# Patient Record
Sex: Female | Born: 1970 | Race: Black or African American | Hispanic: No | Marital: Single | Smoking: Never smoker
Health system: Southern US, Community
[De-identification: ages and names within clinical notes are randomized; demographics above are authoritative.]

## PROBLEM LIST (undated history)

## (undated) DIAGNOSIS — E282 Polycystic ovarian syndrome: Secondary | ICD-10-CM

## (undated) DIAGNOSIS — J40 Bronchitis, not specified as acute or chronic: Secondary | ICD-10-CM

## (undated) DIAGNOSIS — R030 Elevated blood-pressure reading, without diagnosis of hypertension: Secondary | ICD-10-CM

## (undated) HISTORY — DX: Elevated blood-pressure reading, without diagnosis of hypertension: R03.0

## (undated) HISTORY — DX: Polycystic ovarian syndrome: E28.2

## (undated) HISTORY — PX: WISDOM TOOTH EXTRACTION: SHX21

## (undated) HISTORY — DX: Bronchitis, not specified as acute or chronic: J40

---

## 2020-11-24 ENCOUNTER — Ambulatory Visit (AMBULATORY_SURGERY_CENTER): Payer: 59 | Admitting: *Deleted

## 2020-11-24 ENCOUNTER — Other Ambulatory Visit: Payer: Self-pay

## 2020-11-24 VITALS — Ht 63.0 in | Wt 181.0 lb

## 2020-11-24 DIAGNOSIS — Z1211 Encounter for screening for malignant neoplasm of colon: Secondary | ICD-10-CM

## 2020-11-24 MED ORDER — PEG-KCL-NACL-NASULF-NA ASC-C 100 G PO SOLR: 1.0000 | Freq: Once | ORAL | 0 refills | Status: AC

## 2020-11-24 NOTE — Progress Notes (Signed)
Patient's pre-visit was done today over the phone with the patient due to COVID-19 pandemic. Name,DOB and address verified. Insurance verified. Patient denies any allergies to Eggs and Soy. Patient denies any problems with anesthesia/sedation. Patient denies taking diet pills or blood thinners. Packet of Prep instructions mailed to patient including a copy of a consent form-pt is aware. Patient understands to call us back with any questions or concerns. Patient is aware of our care-partner policy and [id number] safety protocol. The patient is COVID-19 vaccinated, per patient.

## 2020-12-08 ENCOUNTER — Ambulatory Visit (AMBULATORY_SURGERY_CENTER): Payer: 59 | Admitting: Gastroenterology

## 2020-12-08 ENCOUNTER — Other Ambulatory Visit: Payer: Self-pay

## 2020-12-08 ENCOUNTER — Encounter: Payer: Self-pay | Admitting: Gastroenterology

## 2020-12-08 VITALS — BP 159/78 | HR 75 | Temp 97.8°F | Resp 11 | Ht 63.0 in | Wt 181.0 lb

## 2020-12-08 DIAGNOSIS — D122 Benign neoplasm of ascending colon: Secondary | ICD-10-CM

## 2020-12-08 DIAGNOSIS — Z1211 Encounter for screening for malignant neoplasm of colon: Secondary | ICD-10-CM

## 2020-12-08 DIAGNOSIS — D120 Benign neoplasm of cecum: Secondary | ICD-10-CM | POA: Diagnosis not present

## 2020-12-08 NOTE — Op Note (Signed)
[facility] Patient Name: Elizabeth Gilmore Procedure Date: 12/08/2020 10:35 AM MRN: [id number] Endoscopist: Remo Lipps P. Havery Moros , MD Age: 50 Referring MD:  Date of Birth: 1971/04/20 Gender: Female Account #: [id number] Procedure:                Colonoscopy Indications:              Screening for colorectal malignant neoplasm, This                            is the patient's first colonoscopy Medicines:                Monitored Anesthesia Care Procedure:                Pre-Anesthesia Assessment:                           - Prior to the procedure, a History and Physical                            was performed, and patient medications and                            allergies were reviewed. The patient's tolerance of                            previous anesthesia was also reviewed. The risks                            and benefits of the procedure and the sedation                            options and risks were discussed with the patient.                            All questions were answered, and informed consent                            was obtained. Prior Anticoagulants: The patient has                            taken no previous anticoagulant or antiplatelet                            agents. ASA Grade Assessment: II - A patient with                            mild systemic disease. After reviewing the risks                            and benefits, the patient was deemed in                            satisfactory condition to undergo the procedure.  After obtaining informed consent, the colonoscope                            was passed under direct vision. Throughout the                            procedure, the patient's blood pressure, pulse, and                            oxygen saturations were monitored continuously. The                            Olympus PFC-[id number] (#[id number]) Colonoscope was                            introduced through the  anus and advanced to the the                            cecum, identified by appendiceal orifice and                            ileocecal valve. The colonoscopy was performed                            without difficulty. The patient tolerated the                            procedure well. The quality of the bowel                            preparation was good. The ileocecal valve,                            appendiceal orifice, and rectum were photographed. Scope In: 10:42:28 AM Scope Out: 10:59:45 AM Scope Withdrawal Time: 0 hours 14 minutes 59 seconds  Total Procedure Duration: 0 hours 17 minutes 17 seconds  Findings:                 The perianal and digital rectal examinations were                            normal.                           A 2 mm polyp was found in the cecum. The polyp was                            sessile. The polyp was removed with a cold biopsy                            forceps. Resection and retrieval were complete.                           A 3 mm polyp was found in the ascending colon. The  polyp was sessile. The polyp was removed with a                            cold biopsy forceps. Resection and retrieval were                            complete.                           Internal hemorrhoids were found during                            retroflexion. The hemorrhoids were small.                           The exam was otherwise without abnormality. Complications:            No immediate complications. Estimated blood loss:                            Minimal. Estimated Blood Loss:     Estimated blood loss was minimal. Impression:               - One 2 mm polyp in the cecum, removed with a cold                            biopsy forceps. Resected and retrieved.                           - One 3 mm polyp in the ascending colon, removed                            with a cold biopsy forceps. Resected and retrieved.                            - Internal hemorrhoids.                           - The examination was otherwise normal. Recommendation:           - Patient has a contact number available for                            emergencies. The signs and symptoms of potential                            delayed complications were discussed with the                            patient. Return to normal activities tomorrow.                            Written discharge instructions were provided to the                            patient.                           -  Resume previous diet.                           - Continue present medications.                           - Await pathology results. Remo Lipps P. [name], MD 12/08/2020 11:03:33 AM This report has been signed electronically.

## 2020-12-08 NOTE — Patient Instructions (Signed)
Discharge instructions given. Handouts on polyps and hemorrhoids. Resume previous medications. YOU HAD AN ENDOSCOPIC PROCEDURE TODAY AT THE [facility]:   Refer to the procedure report that was given to you for any specific questions about what was found during the examination.  If the procedure report does not answer your questions, please call your gastroenterologist to clarify.  If you requested that your care partner not be given the details of your procedure findings, then the procedure report has been included in a sealed envelope for you to review at your convenience later.  YOU SHOULD EXPECT: Some feelings of bloating in the abdomen. Passage of more gas than usual.  Walking can help get rid of the air that was put into your GI tract during the procedure and reduce the bloating. If you had a lower endoscopy (such as a colonoscopy or flexible sigmoidoscopy) you may notice spotting of blood in your stool or on the toilet paper. If you underwent a bowel prep for your procedure, you may not have a normal bowel movement for a few days.  Please Note:  You might notice some irritation and congestion in your nose or some drainage.  This is from the oxygen used during your procedure.  There is no need for concern and it should clear up in a day or so.  SYMPTOMS TO REPORT IMMEDIATELY:  Following lower endoscopy (colonoscopy or flexible sigmoidoscopy):  Excessive amounts of blood in the stool  Significant tenderness or worsening of abdominal pains  Swelling of the abdomen that is new, acute  Fever of 100F or higher   For urgent or emergent issues, a gastroenterologist can be reached at any hour by calling [phone]. Do not use MyChart messaging for urgent concerns.    DIET:  We do recommend a small meal at first, but then you may proceed to your regular diet.  Drink plenty of fluids but you should avoid alcoholic beverages for 24 hours.  ACTIVITY:  You should plan to take it  easy for the rest of today and you should NOT DRIVE or use heavy machinery until tomorrow (because of the sedation medicines used during the test).    FOLLOW UP: Our staff will call the number listed on your records 48-72 hours following your procedure to check on you and address any questions or concerns that you may have regarding the information given to you following your procedure. If we do not reach you, we will leave a message.  We will attempt to reach you two times.  During this call, we will ask if you have developed any symptoms of COVID 19. If you develop any symptoms (ie: fever, flu-like symptoms, shortness of breath, cough etc.) before then, please call [phone].  If you test positive for Covid 19 in the 2 weeks post procedure, please call and report this information to us.    If any biopsies were taken you will be contacted by phone or by letter within the next 1-3 weeks.  Please call us at [phone] if you have not heard about the biopsies in 3 weeks.    SIGNATURES/CONFIDENTIALITY: You and/or your care partner have signed paperwork which will be entered into your electronic medical record.  These signatures attest to the fact that that the information above on your After Visit Summary has been reviewed and is understood.  Full responsibility of the confidentiality of this discharge information lies with you and/or your care-partner.  

## 2020-12-08 NOTE — Progress Notes (Signed)
Pt's states no medical or surgical changes since previsit or office visit. 

## 2020-12-08 NOTE — Progress Notes (Signed)
PT taken to PACU. Monitors in place. VSS. Report given to RN. 

## 2020-12-08 NOTE — Progress Notes (Signed)
Called to room to assist during endoscopic procedure.  Patient ID and intended procedure confirmed with present staff. Received instructions for my participation in the procedure from the performing physician.  

## 2020-12-12 ENCOUNTER — Telehealth: Payer: Self-pay

## 2020-12-12 NOTE — Telephone Encounter (Signed)
  Follow up Call-  Call back number 12/08/2020  Post procedure Call Back phone  # [phone]  Permission to leave phone message Yes     1st follow up call made. NALM

## 2020-12-12 NOTE — Telephone Encounter (Signed)
Attempted to reach patient for post-procedure f/u call. No answer. Left message for her to please not hesitate to call us if she has any questions/concerns regarding her care. 

## 2020-12-13 ENCOUNTER — Other Ambulatory Visit: Payer: Self-pay

## 2020-12-13 ENCOUNTER — Emergency Department (HOSPITAL_COMMUNITY)
Admission: EM | Admit: 2020-12-13 | Discharge: 2020-12-14 | Disposition: A | Discharge status: Home / Self Care | Payer: 59 | Attending: Emergency Medicine | Admitting: Emergency Medicine

## 2020-12-13 ENCOUNTER — Encounter (HOSPITAL_COMMUNITY): Payer: Self-pay | Admitting: Emergency Medicine

## 2020-12-13 DIAGNOSIS — I10 Essential (primary) hypertension: Secondary | ICD-10-CM | POA: Insufficient documentation

## 2020-12-13 DIAGNOSIS — R1110 Vomiting, unspecified: Secondary | ICD-10-CM | POA: Insufficient documentation

## 2020-12-13 DIAGNOSIS — Z20822 Contact with and (suspected) exposure to covid-19: Secondary | ICD-10-CM | POA: Insufficient documentation

## 2020-12-13 DIAGNOSIS — J40 Bronchitis, not specified as acute or chronic: Secondary | ICD-10-CM | POA: Insufficient documentation

## 2020-12-13 DIAGNOSIS — R059 Cough, unspecified: Secondary | ICD-10-CM | POA: Diagnosis present

## 2020-12-13 NOTE — ED Provider Notes (Signed)
[facility] EMERGENCY DEPARTMENT Provider Note   CSN: [id number] Arrival date & time: 12/13/20  2258     History Chief Complaint  Patient presents with   Emesis    Elizabeth Gilmore is a 50 y.o. female.  Patient presents to the emergency department for evaluation of a week of illness.  Patient has been having sinus congestion and scratchy throat, chills.  She has had some intermittent vomiting, no abdominal pain.  No diarrhea.  Patient reports that she has had some cough and has now started to develop some pain in her chest when she coughs.  Today she started feeling short of breath which is what brought her in tonight.  She has not taken any home COVID test but has been vaccinated.      Past Medical History:  Diagnosis Date   Bronchitis    Elevated blood-pressure reading without diagnosis of hypertension    Polycystic disease, ovaries     There are no problems to display for this patient.   Past Surgical History:  Procedure Laterality Date   WISDOM TOOTH EXTRACTION       OB History   No obstetric history on file.     Family History  Problem Relation Age of Onset   Colon cancer Neg Hx    Colon polyps Neg Hx    Esophageal cancer Neg Hx    Rectal cancer Neg Hx    Stomach cancer Neg Hx     Social History   Tobacco Use   Smoking status: Never   Smokeless tobacco: Never  Vaping Use   Vaping Use: Never used  Substance Use Topics   Alcohol use: Yes    Comment: occ   Drug use: Not Currently    Home Medications Prior to Admission medications   Medication Sig Start Date End Date Taking? Authorizing Provider  albuterol (VENTOLIN HFA) 108 (90 Base) MCG/ACT inhaler Inhale 2 puffs into the lungs every 4 (four) hours as needed for wheezing or shortness of breath. 12/14/20  Yes Rafael Quesada, Gwenyth Allegra, MD  benzonatate (TESSALON) 200 MG capsule Take 1 capsule (200 mg total) by mouth 3 (three) times daily as needed for cough. 12/14/20  Yes Deshia Vanderhoof, Gwenyth Allegra, MD   ondansetron (ZOFRAN) 4 MG tablet Take 1 tablet (4 mg total) by mouth every 6 (six) hours as needed for nausea or vomiting. 12/14/20  Yes Jannice Beitzel, Gwenyth Allegra, MD  predniSONE (DELTASONE) 20 MG tablet Take 2 tablets (40 mg total) by mouth daily with breakfast. 12/14/20  Yes Travon Crochet, Gwenyth Allegra, MD  terbinafine (LAMISIL) 250 MG tablet Take 250 mg by mouth daily.    [provider]    Allergies    Shellfish allergy  Review of Systems   Review of Systems  Constitutional:  Positive for chills.  HENT:  Positive for congestion and sore throat.   Respiratory:  Positive for cough and shortness of breath.   All other systems reviewed and are negative.  Physical Exam Updated Vital Signs BP (!) 164/92   Pulse 76   Temp 98.1 F (36.7 C)   Resp 18   Ht 5\' 2"  (1.575 m)   Wt 79.4 kg   LMP 11/20/2020   SpO2 100%   BMI 32.01 kg/m   Physical Exam Vitals and nursing note reviewed.  Constitutional:      General: She is not in acute distress.    Appearance: Normal appearance. She is well-developed.  HENT:     Head: Normocephalic and atraumatic.  Right Ear: Hearing normal.     Left Ear: Hearing normal.     Nose: Nose normal.  Eyes:     Conjunctiva/sclera: Conjunctivae normal.     Pupils: Pupils are equal, round, and reactive to light.  Cardiovascular:     Rate and Rhythm: Regular rhythm.     Heart sounds: S1 normal and S2 normal. No murmur heard.   No friction rub. No gallop.  Pulmonary:     Effort: Pulmonary effort is normal. No respiratory distress.     Breath sounds: Normal breath sounds.  Chest:     Chest wall: No tenderness.  Abdominal:     General: Bowel sounds are normal.     Palpations: Abdomen is soft.     Tenderness: There is no abdominal tenderness. There is no guarding or rebound. Negative signs include Murphy's sign and McBurney's sign.     Hernia: No hernia is present.  Musculoskeletal:        General: Normal range of motion.     Cervical back: Normal  range of motion and neck supple.  Skin:    General: Skin is warm and dry.     Findings: No rash.  Neurological:     Mental Status: She is alert and oriented to person, place, and time.     GCS: GCS eye subscore is 4. GCS verbal subscore is 5. GCS motor subscore is 6.     Cranial Nerves: No cranial nerve deficit.     Sensory: No sensory deficit.     Coordination: Coordination normal.  Psychiatric:        Speech: Speech normal.        Behavior: Behavior normal.        Thought Content: Thought content normal.    ED Results / Procedures / Treatments   Labs (all labs ordered are listed, but only abnormal results are displayed) Labs Reviewed  COMPREHENSIVE METABOLIC PANEL - Abnormal; Notable for the following components:      Result Value   Creatinine, Ser 1.05 (*)    Calcium 8.6 (*)    Alkaline Phosphatase 36 (*)    All other components within normal limits  RESP PANEL BY RT-PCR (FLU A&B, COVID) ARPGX2  CBC WITH DIFFERENTIAL/PLATELET    EKG EKG Interpretation  Date/Time:  Thursday December 14 2020 00:13:40 EDT Ventricular Rate:  64 PR Interval:  149 QRS Duration: 81 QT Interval:  400 QTC Calculation: 413 R Axis:   51 Text Interpretation: Sinus rhythm Ventricular premature complex Low voltage, precordial leads Probable anteroseptal infarct, old Confirmed by Orpah Greek [phone]) on 12/14/2020 12:24:11 AM  Radiology DG Chest Port 1 View  Result Date: 12/14/2020 CLINICAL DATA:  Cough, sore throat, emesis EXAM: PORTABLE CHEST 1 VIEW COMPARISON:  None. FINDINGS: The heart size and mediastinal contours are within normal limits. Both lungs are clear. The visualized skeletal structures are unremarkable. IMPRESSION: No active disease. Electronically Signed   By: Fidela Salisbury MD   On: 12/14/2020 01:58    Procedures Procedures   Medications Ordered in ED Medications - No data to display  ED Course  I have reviewed the triage vital signs and the nursing notes.  Pertinent  labs & imaging results that were available during my care of the patient were reviewed by me and considered in my medical decision making (see chart for details).    MDM Rules/Calculators/A&P  Patient presents to the emergency department for evaluation of 1 week of URI symptoms.  She has had some intermittent vomiting as well.  Abdominal exam benign, nontender.  No concern for acute surgical abdominal process.  Patient's COVID test is negative.  Chest x-ray clear, no evidence of pneumonia.  Will treat for acute bronchitis.  She appears well, oxygen saturations are 100%.  No need for hospitalization at this time.  Final Clinical Impression(s) / ED Diagnoses Final diagnoses:  Bronchitis    Rx / DC Orders ED Discharge Orders          Ordered    benzonatate (TESSALON) 200 MG capsule  3 times daily PRN        12/14/20 0202    predniSONE (DELTASONE) 20 MG tablet  Daily with breakfast        12/14/20 0202    albuterol (VENTOLIN HFA) 108 (90 Base) MCG/ACT inhaler  Every 4 hours PRN        12/14/20 0202    ondansetron (ZOFRAN) 4 MG tablet  Every 6 hours PRN        12/14/20 0202             Orpah Greek, MD 12/14/20 0202

## 2020-12-13 NOTE — ED Triage Notes (Signed)
Pt c/o cough, sore throat and emesis.

## 2020-12-14 ENCOUNTER — Emergency Department (HOSPITAL_COMMUNITY): Payer: 59

## 2020-12-14 LAB — CBC WITH DIFFERENTIAL/PLATELET
Abs Immature Granulocytes: 0.02 10*3/uL (ref 0.00–0.07)
Basophils Absolute: 0.1 10*3/uL (ref 0.0–0.1)
Basophils Relative: 1 %
Eosinophils Absolute: 0.1 10*3/uL (ref 0.0–0.5)
Eosinophils Relative: 2 %
HCT: 38.8 % (ref 36.0–46.0)
Hemoglobin: 12.7 g/dL (ref 12.0–15.0)
Immature Granulocytes: 0 %
Lymphocytes Relative: 33 %
Lymphs Abs: 1.8 10*3/uL (ref 0.7–4.0)
MCH: 28.3 pg (ref 26.0–34.0)
MCHC: 32.7 g/dL (ref 30.0–36.0)
MCV: 86.4 fL (ref 80.0–100.0)
Monocytes Absolute: 0.7 10*3/uL (ref 0.1–1.0)
Monocytes Relative: 12 %
Neutro Abs: 2.9 10*3/uL (ref 1.7–7.7)
Neutrophils Relative %: 52 %
Platelets: 312 10*3/uL (ref 150–400)
RBC: 4.49 MIL/uL (ref 3.87–5.11)
RDW: 12.7 % (ref 11.5–15.5)
WBC: 5.6 10*3/uL (ref 4.0–10.5)
nRBC: 0 % (ref 0.0–0.2)

## 2020-12-14 LAB — COMPREHENSIVE METABOLIC PANEL
ALT: 17 U/L (ref 0–44)
AST: 15 U/L (ref 15–41)
Albumin: 3.5 g/dL (ref 3.5–5.0)
Alkaline Phosphatase: 36 U/L — ABNORMAL LOW (ref 38–126)
Anion gap: 7 (ref 5–15)
BUN: 10 mg/dL (ref 6–20)
CO2: 27 mmol/L (ref 22–32)
Calcium: 8.6 mg/dL — ABNORMAL LOW (ref 8.9–10.3)
Chloride: 103 mmol/L (ref 98–111)
Creatinine, Ser: 1.05 mg/dL — ABNORMAL HIGH (ref 0.44–1.00)
GFR, Estimated: >60 mL/min (ref >60)
Glucose, Bld: 93 mg/dL (ref 70–99)
Potassium: 3.7 mmol/L (ref 3.5–5.1)
Sodium: 137 mmol/L (ref 135–145)
Total Bilirubin: 0.3 mg/dL (ref 0.3–1.2)
Total Protein: 6.6 g/dL (ref 6.5–8.1)

## 2020-12-14 LAB — RESP PANEL BY RT-PCR (FLU A&B, COVID) ARPGX2
Influenza A by PCR: NEGATIVE
Influenza B by PCR: NEGATIVE
SARS Coronavirus 2 by RT PCR: NEGATIVE

## 2020-12-14 MED ORDER — ALBUTEROL SULFATE HFA 108 (90 BASE) MCG/ACT IN AERS: 2.0000 | INHALATION_SPRAY | RESPIRATORY_TRACT | 2 refills | Status: AC | PRN

## 2020-12-14 MED ORDER — ONDANSETRON HCL 4 MG PO TABS: 4.0000 mg | ORAL_TABLET | Freq: Four times a day (QID) | ORAL | 0 refills | Status: AC | PRN

## 2020-12-14 MED ORDER — BENZONATATE 200 MG PO CAPS: 200.0000 mg | ORAL_CAPSULE | Freq: Three times a day (TID) | ORAL | 0 refills | Status: AC | PRN

## 2020-12-14 MED ORDER — PREDNISONE 20 MG PO TABS: 40.0000 mg | ORAL_TABLET | Freq: Every day | ORAL | 0 refills | Status: AC

## 2020-12-16 ENCOUNTER — Encounter: Payer: Self-pay | Admitting: Gastroenterology

## 2022-07-02 IMAGING — DX DG CHEST 1V PORT
1 series · 1 of 1 positions shown · non-contrast
Comparison: None.

CLINICAL DATA: Cough, sore throat, emesis

EXAM:
PORTABLE CHEST 1 VIEW

[chest ap]
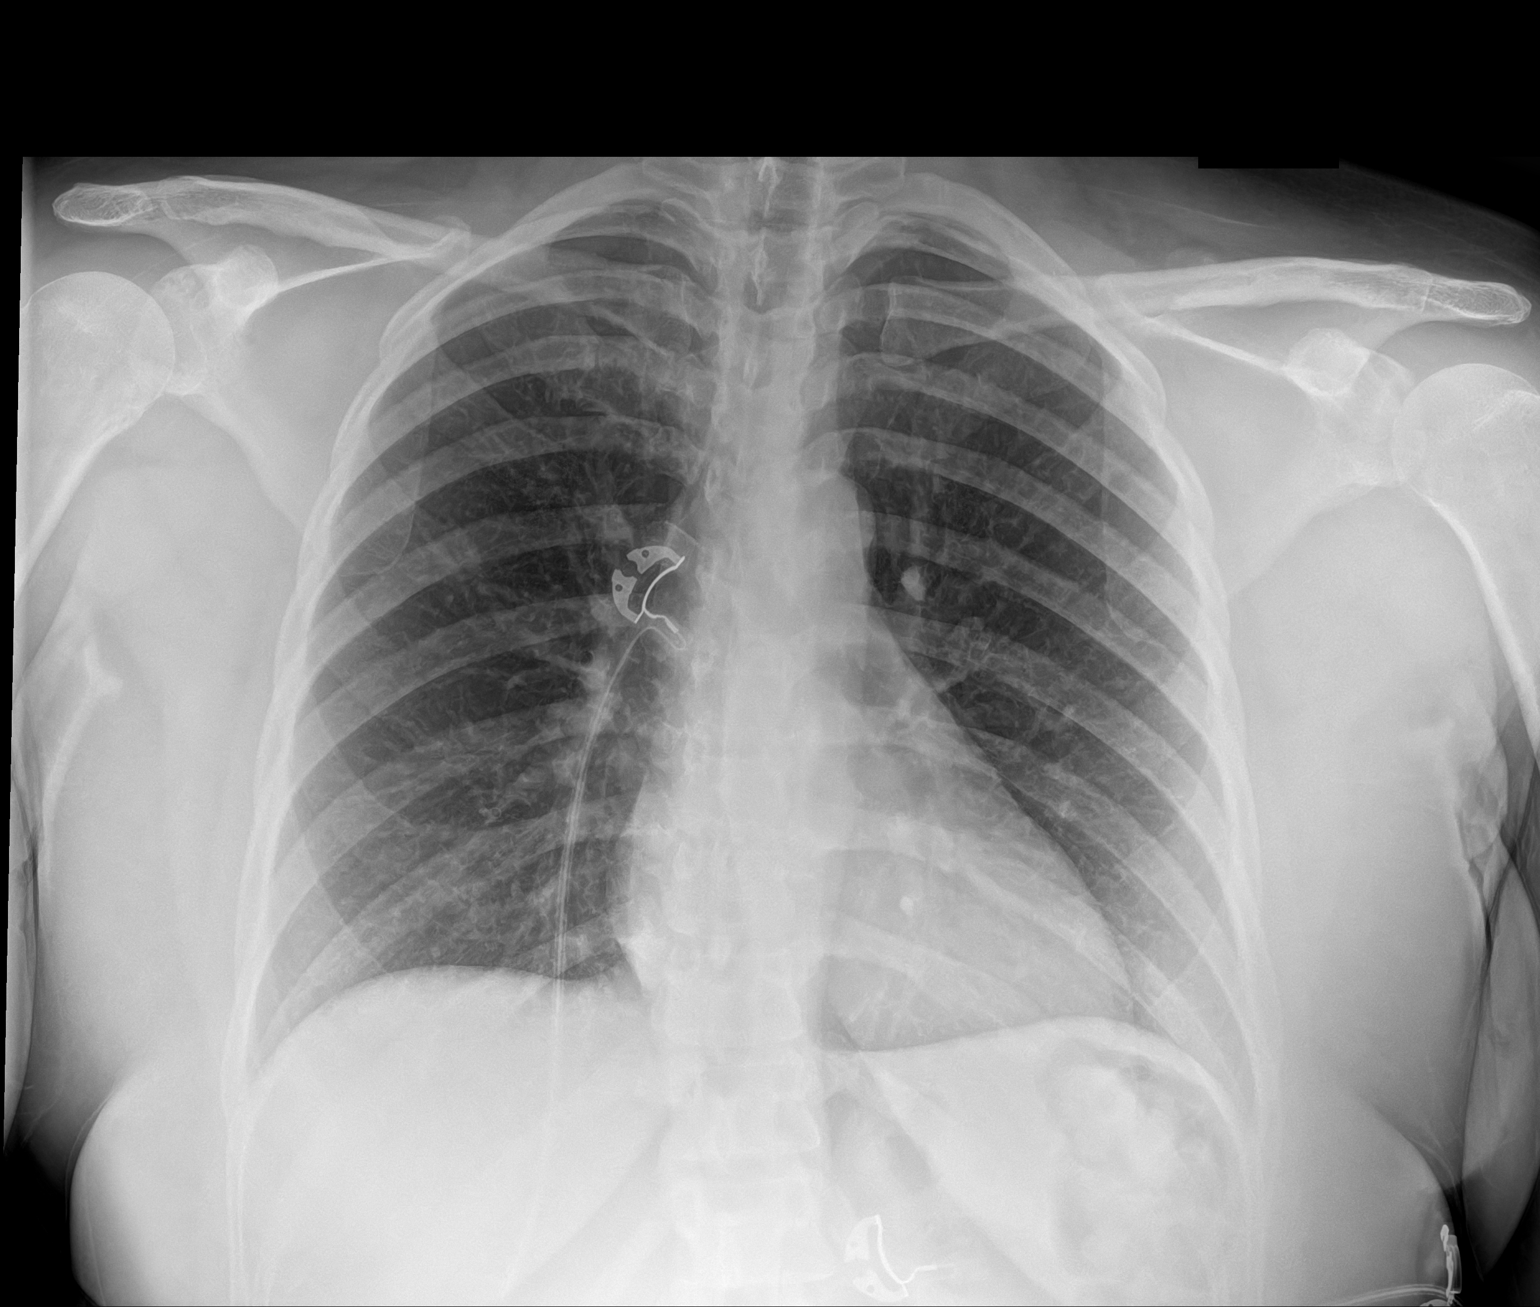

[1 of 1 positions shown; findings below may reference images not displayed]

FINDINGS: The heart size and mediastinal contours are within normal limits.
Both lungs are clear. The visualized skeletal structures are
unremarkable.
IMPRESSION: No active disease.
